# Patient Record
Sex: Male | Born: 1990 | Race: White | Hispanic: No | Marital: Married | Smoking: Current every day smoker
Health system: Southern US, Community
[De-identification: ages and names within clinical notes are randomized; demographics above are authoritative.]

## PROBLEM LIST (undated history)

## (undated) DIAGNOSIS — F419 Anxiety disorder, unspecified: Secondary | ICD-10-CM

## (undated) DIAGNOSIS — M1990 Unspecified osteoarthritis, unspecified site: Secondary | ICD-10-CM

## (undated) DIAGNOSIS — J302 Other seasonal allergic rhinitis: Secondary | ICD-10-CM

## (undated) HISTORY — DX: Anxiety disorder, unspecified: F41.9

## (undated) HISTORY — DX: Unspecified osteoarthritis, unspecified site: M19.90

## (undated) HISTORY — DX: Other seasonal allergic rhinitis: J30.2

---

## 2015-04-03 ENCOUNTER — Other Ambulatory Visit: Payer: Self-pay

## 2015-04-03 ENCOUNTER — Emergency Department: Payer: BLUE CROSS/BLUE SHIELD

## 2015-04-03 ENCOUNTER — Encounter: Payer: Self-pay | Admitting: Emergency Medicine

## 2015-04-03 ENCOUNTER — Emergency Department
Admission: EM | Admit: 2015-04-03 | Discharge: 2015-04-03 | Disposition: A | Discharge status: Home / Self Care | Payer: BLUE CROSS/BLUE SHIELD | Attending: Emergency Medicine | Admitting: Emergency Medicine

## 2015-04-03 DIAGNOSIS — Z720 Tobacco use: Secondary | ICD-10-CM | POA: Diagnosis not present

## 2015-04-03 DIAGNOSIS — Z880 Allergy status to penicillin: Secondary | ICD-10-CM | POA: Insufficient documentation

## 2015-04-03 DIAGNOSIS — K529 Noninfective gastroenteritis and colitis, unspecified: Secondary | ICD-10-CM | POA: Insufficient documentation

## 2015-04-03 DIAGNOSIS — R1013 Epigastric pain: Secondary | ICD-10-CM | POA: Diagnosis present

## 2015-04-03 LAB — CBC WITH DIFFERENTIAL/PLATELET
Basophils Absolute: 0 10*3/uL (ref 0–0.1)
Basophils Relative: 0 %
Eosinophils Absolute: 0 10*3/uL (ref 0–0.7)
Eosinophils Relative: 0 %
HEMATOCRIT: 48.2 % (ref 40.0–52.0)
HEMOGLOBIN: 16.6 g/dL (ref 13.0–18.0)
Lymphocytes Relative: 3 %
Lymphs Abs: 0.5 10*3/uL — ABNORMAL LOW (ref 1.0–3.6)
MCH: 28.5 pg (ref 26.0–34.0)
MCHC: 34.5 g/dL (ref 32.0–36.0)
MCV: 82.6 fL (ref 80.0–100.0)
MONOS PCT: 3 %
Monocytes Absolute: 0.5 10*3/uL (ref 0.2–1.0)
NEUTROS ABS: 14.3 10*3/uL — AB (ref 1.4–6.5)
Neutrophils Relative %: 94 %
Platelets: 165 10*3/uL (ref 150–440)
RBC: 5.84 MIL/uL (ref 4.40–5.90)
RDW: 13.3 % (ref 11.5–14.5)
WBC: 15.3 10*3/uL — AB (ref 3.8–10.6)

## 2015-04-03 LAB — COMPREHENSIVE METABOLIC PANEL
ALBUMIN: 4.7 g/dL (ref 3.5–5.0)
ALK PHOS: 69 U/L (ref 38–126)
ALT: 31 U/L (ref 17–63)
AST: 25 U/L (ref 15–41)
Anion gap: 10 (ref 5–15)
BUN: 20 mg/dL (ref 6–20)
CO2: 23 mmol/L (ref 22–32)
Calcium: 9.4 mg/dL (ref 8.9–10.3)
Chloride: 106 mmol/L (ref 101–111)
Creatinine, Ser: 0.73 mg/dL (ref 0.61–1.24)
GFR calc Af Amer: >60 mL/min (ref >60)
GFR calc non Af Amer: >60 mL/min (ref >60)
GLUCOSE: 126 mg/dL — AB (ref 65–99)
POTASSIUM: 3.6 mmol/L (ref 3.5–5.1)
Sodium: 139 mmol/L (ref 135–145)
Total Bilirubin: 1.4 mg/dL — ABNORMAL HIGH (ref 0.3–1.2)
Total Protein: 8.2 g/dL — ABNORMAL HIGH (ref 6.5–8.1)

## 2015-04-03 LAB — URINALYSIS COMPLETE WITH MICROSCOPIC (ARMC ONLY)
Bacteria, UA: NONE SEEN
Bilirubin Urine: NEGATIVE
Glucose, UA: NEGATIVE mg/dL
Hgb urine dipstick: NEGATIVE
Leukocytes, UA: NEGATIVE
Nitrite: NEGATIVE
Protein, ur: 30 mg/dL — AB
RBC / HPF: NONE SEEN RBC/hpf (ref 0–5)
SPECIFIC GRAVITY, URINE: 1.034 — AB (ref 1.005–1.030)
pH: 5 (ref 5.0–8.0)

## 2015-04-03 LAB — LIPASE, BLOOD: LIPASE: 28 U/L (ref 22–51)

## 2015-04-03 MED ORDER — IOHEXOL 300 MG/ML  SOLN
100.0000 mL | Freq: Once | INTRAMUSCULAR | Status: AC | PRN
  Administered 2015-04-03: 100 mL via INTRAVENOUS

## 2015-04-03 MED ORDER — IOHEXOL 300 MG/ML  SOLN: 25.0000 mL | Freq: Once | INTRAMUSCULAR | Status: DC | PRN

## 2015-04-03 MED ORDER — ONDANSETRON HCL 4 MG/2ML IJ SOLN
INTRAMUSCULAR | Status: AC
  Administered 2015-04-03: 4 mg via INTRAVENOUS
  Filled 2015-04-03: qty 2

## 2015-04-03 MED ORDER — MORPHINE SULFATE 4 MG/ML IJ SOLN
4.0000 mg | Freq: Once | INTRAMUSCULAR | Status: AC
  Administered 2015-04-03: 4 mg via INTRAVENOUS

## 2015-04-03 MED ORDER — MORPHINE SULFATE 4 MG/ML IJ SOLN
INTRAMUSCULAR | Status: AC
  Administered 2015-04-03: 4 mg via INTRAVENOUS
  Filled 2015-04-03: qty 1

## 2015-04-03 MED ORDER — KETOROLAC TROMETHAMINE 30 MG/ML IJ SOLN
INTRAMUSCULAR | Status: AC
  Administered 2015-04-03: 30 mg via INTRAVENOUS
  Filled 2015-04-03: qty 1

## 2015-04-03 MED ORDER — KETOROLAC TROMETHAMINE 30 MG/ML IJ SOLN
30.0000 mg | Freq: Once | INTRAMUSCULAR | Status: AC
  Administered 2015-04-03: 30 mg via INTRAVENOUS

## 2015-04-03 MED ORDER — SODIUM CHLORIDE 0.9 % IV SOLN
Freq: Once | INTRAVENOUS | Status: AC
  Administered 2015-04-03: 13:00:00 via INTRAVENOUS

## 2015-04-03 MED ORDER — IOHEXOL 240 MG/ML SOLN: 25.0000 mL | Freq: Once | INTRAMUSCULAR | Status: DC | PRN

## 2015-04-03 MED ORDER — SODIUM CHLORIDE 0.9 % IV BOLUS (SEPSIS)
1000.0000 mL | Freq: Once | INTRAVENOUS | Status: AC
  Administered 2015-04-03: 1000 mL via INTRAVENOUS

## 2015-04-03 MED ORDER — ONDANSETRON HCL 4 MG PO TABS: 4.0000 mg | ORAL_TABLET | Freq: Every day | ORAL | Status: AC | PRN

## 2015-04-03 MED ORDER — IOHEXOL 240 MG/ML SOLN
25.0000 mL | Freq: Once | INTRAMUSCULAR | Status: AC | PRN
  Administered 2015-04-03: 25 mL via ORAL

## 2015-04-03 MED ORDER — ONDANSETRON HCL 4 MG/2ML IJ SOLN
4.0000 mg | Freq: Once | INTRAMUSCULAR | Status: AC
  Administered 2015-04-03: 4 mg via INTRAVENOUS

## 2015-04-03 NOTE — ED Notes (Signed)
Pt awoke from sleep, N/V/D, and feeling weak since, denies fever

## 2015-04-03 NOTE — ED Notes (Signed)
MD at bedside. 

## 2015-04-03 NOTE — ED Notes (Signed)
Pt to ed with c/o centralized abd pain, n/v/d since yesterday.  Pt reports vomiting x 3 today, diarrhea x 4 today.  [facility].  Pt skin warm and dry.

## 2015-04-03 NOTE — ED Provider Notes (Addendum)
[facility] Emergency Department Provider Note  Time seen: 12:50 PM  I have reviewed the triage vital signs and the nursing notes.   HISTORY  Chief Complaint Abdominal Pain; Emesis; and Nausea    HPI Daniel Moss is a 24 y.o. male with no past medical history who presents the emergency department with nausea, vomiting, diarrhea, and epigastric abdominal pain 10 hours. According to the patient he was trying to go to sleep at around 2:30 AM he became acutely nauseated, with epigastric pain, felt like he had to have a bowel movement. He states he had very liquidy diarrhea, and vomited times one. He states he abdominal pain was gone after that, however approximately 15-30 minutes later the pain, nausea came back, and the patient had to go to the bathroom again. Patient states since he has had intermittent nausea, vomiting, diarrhea and a dull persistent epigastric pain. The patient states the pain is moderate in severity, dull/aching sensation, worse with vomiting. Denies any alcohol use. Denies any abdominal surgeries in the past. Denies any black or bloody stool. Patient states he continues to feel nauseated with mild epigastric pain at this time. His last bowel movement was apparently 1 hour ago.     History reviewed. No pertinent past medical history.  There are no active problems to display for this patient.   History reviewed. No pertinent past surgical history.  No current outpatient prescriptions on file.  Allergies Amoxicillin  No family history on file.  Social History History  Substance Use Topics  . Smoking status: Current Every Day Smoker  . Smokeless tobacco: Not on file  . Alcohol Use: Yes    Review of Systems Constitutional: Negative for fever. Cardiovascular: Negative for chest pain. Respiratory: Negative for shortness of breath. Gastrointestinal: Positive for epigastric abdominal pain, nausea, vomiting, diarrhea. Genitourinary:  Negative for dysuria. Musculoskeletal: Negative for back pain. 10-point ROS otherwise negative.  ____________________________________________   PHYSICAL EXAM:  VITAL SIGNS: ED Triage Vitals  Enc Vitals Group     BP 04/03/15 1244 132/86 mmHg     Pulse Rate 04/03/15 1244 84     Resp 04/03/15 1244 18     Temp 04/03/15 1244 97.6 F (36.4 C)     Temp Source 04/03/15 1244 Oral     SpO2 04/03/15 1244 98 %     Weight 04/03/15 1244 209 lb (94.802 kg)     Height 04/03/15 1244 5\' 8"  (1.727 m)     Head Cir --      Peak Flow --      Pain Score 04/03/15 1159 8     Pain Loc --      Pain Edu? --      Excl. in GC? --     Constitutional: Alert and oriented. No distress. Eyes: Normal exam ENT   Head: Normocephalic   Mouth/Throat: Dry mucous membranes. Cardiovascular: Normal rate, regular rhythm. No murmur Respiratory: Normal respiratory effort without tachypnea nor retractions. Breath sounds are clear  Gastrointestinal: Mild epigastric tenderness to palpation, no rebound or guarding. Abdomen otherwise benign. Musculoskeletal: Nontender with normal range of motion in all extremities.  Neurologic:  Normal speech and language. No gross focal neurologic deficits are appreciated. Speech is normal. Skin:  Skin is warm, dry and intact.  Psychiatric: Mood and affect are normal. Speech and behavior are normal.  ____________________________________________   RADIOLOGY  CT consistent multiple fluid-filled bowel loops, but no acute abnormalities.  ____________________________________________    INITIAL IMPRESSION / ASSESSMENT AND PLAN /  ED COURSE  Pertinent labs & imaging results that were available during my care of the patient were reviewed by me and considered in my medical decision making (see chart for details).  Patient with likely gastroenteritis, nausea, vomiting, mild epigastric abdominal pain. Patient's exam is normal besides dry mucous membranes, and mild epigastric  tenderness. Patient has no right upper quadrant tenderness to palpation, and no other abdominal tenderness palpation. We will check labs, urinalysis, lipase, IV hydrate, and treat nausea in the emergency department.  CT scan consistent with fluid-filled bowel loops, but no obstruction or other abnormality noted. Likely gastroenteritis. Patient feeling somewhat better, we'll discharge home on Zofran, Imodium, and supportive care.  Patient states mild chest discomfort/epigastric pain which continues, EKG was checked showing, reviewed and interpreted by myself showing normal sinus rhythm at 72 bpm, narrow QRS, normal axis, normal intervals, no ST changes noted. Overall normal EKG. We will continue with discharge home with supportive care. ____________________________________________   FINAL CLINICAL IMPRESSION(S) / ED DIAGNOSES  Nausea and vomiting Diarrhea Epigastric abdominal pain Gastroenteritis   Minna Antis, MD 04/03/15 0981  Minna Antis, MD 04/03/15 1914

## 2015-04-03 NOTE — Discharge Instructions (Signed)
Viral Gastroenteritis Viral gastroenteritis is also called stomach flu. This illness is caused by a certain type of germ (virus). It can cause sudden watery poop (diarrhea) and throwing up (vomiting). This can cause you to lose body fluids (dehydration). This illness usually lasts for 3 to 8 days. It usually goes away on its own. HOME CARE   Drink enough fluids to keep your pee (urine) clear or pale yellow. Drink small amounts of fluids often.  Ask your doctor how to replace body fluid losses (rehydration).  Avoid:  Foods high in sugar.  Alcohol.  Bubbly (carbonated) drinks.  Tobacco.  Juice.  Caffeine drinks.  Very hot or cold fluids.  Fatty, greasy foods.  Eating too much at one time.  Dairy products until 24 to 48 hours after your watery poop stops.  You may eat foods with active cultures (probiotics). They can be found in some yogurts and supplements.  Wash your hands well to avoid spreading the illness.  Only take medicines as told by your doctor. Do not give aspirin to children. Do not take medicines for watery poop (antidiarrheals).  Ask your doctor if you should keep taking your regular medicines.  Keep all doctor visits as told. GET HELP RIGHT AWAY IF:   You cannot keep fluids down.  You do not pee at least once every 6 to 8 hours.  You are short of breath.  You see blood in your poop or throw up. This may look like coffee grounds.  You have belly (abdominal) pain that gets worse or is just in one small spot (localized).  You keep throwing up or having watery poop.  You have a fever.  The patient is a child younger than 3 months, and he or she has a fever.  The patient is a child older than 3 months, and he or she has a fever and problems that do not go away.  The patient is a child older than 3 months, and he or she has a fever and problems that suddenly get worse.  The patient is a baby, and he or she has no tears when crying. MAKE SURE YOU:     Understand these instructions.  Will watch your condition.  Will get help right away if you are not doing well or get worse. Document Released: 03/10/2008 Document Revised: 12/15/2011 Document Reviewed: 07/09/2011 Exit[employer] Patient Information 2015 [employer]. This information is not intended to replace advice given to you by your health care provider. Make sure you discuss any questions you have with your health care provider.  

## 2016-06-07 IMAGING — CT CT ABD-PELV W/ CM
1 of 2 series · 15 of 32 positions shown, 19 images · IV contrast (omnipaque)
Comparison: None.

CLINICAL DATA: Abdominal pain and nausea, diarrhea and vomiting for
multiple days

EXAM:
CT ABDOMEN AND PELVIS WITH CONTRAST
TECHNIQUE: Multidetector CT imaging of the abdomen and pelvis was performed
using the standard protocol following bolus administration of
intravenous contrast.
CONTRAST:  100mL OMNIPAQUE IOHEXOL 300 MG/ML SOLN, 25mL OMNIPAQUE
IOHEXOL 240 MG/ML SOLN, 1 OMNIPAQUE IOHEXOL 240 MG/ML SOLN

[Series 2: routine abd pel with · axial · 0.79mm/px · z∈[-865,-390]mm · 15 of 105 slices shown, 19 images]
[im 5/105  soft-tissue]
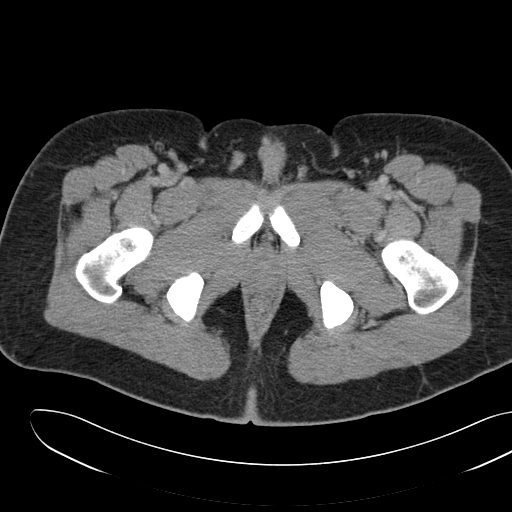
[im 5/105  bone]
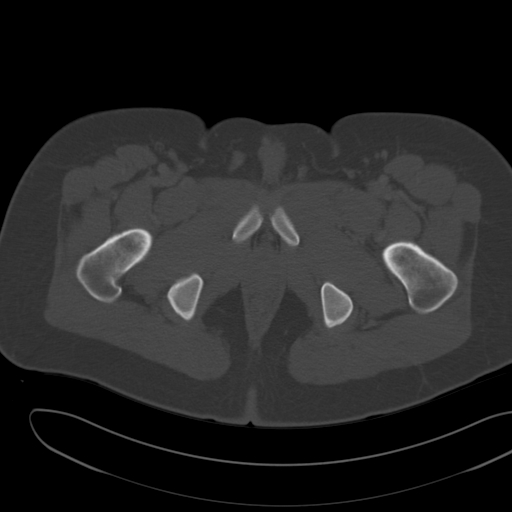
[im 14/105  soft-tissue]
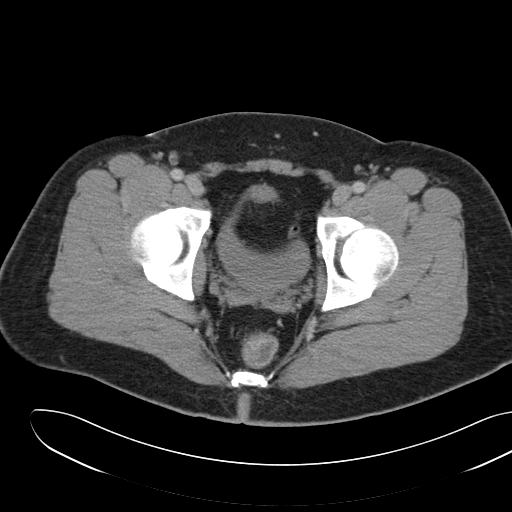
[im 23/105  soft-tissue]
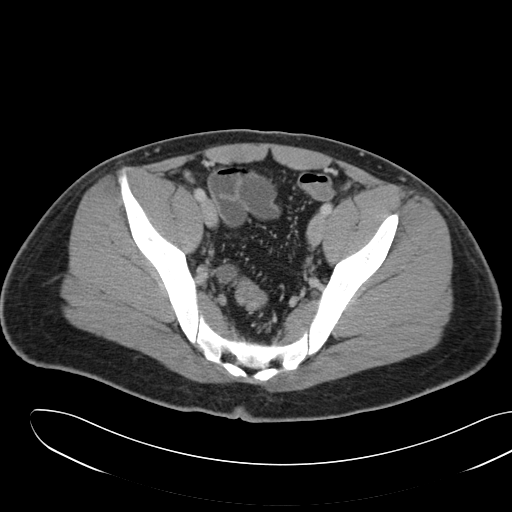
[im 28/105  soft-tissue]
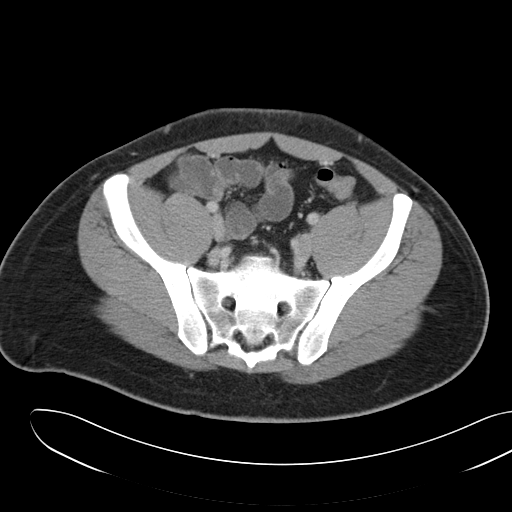
[im 37/105  soft-tissue]
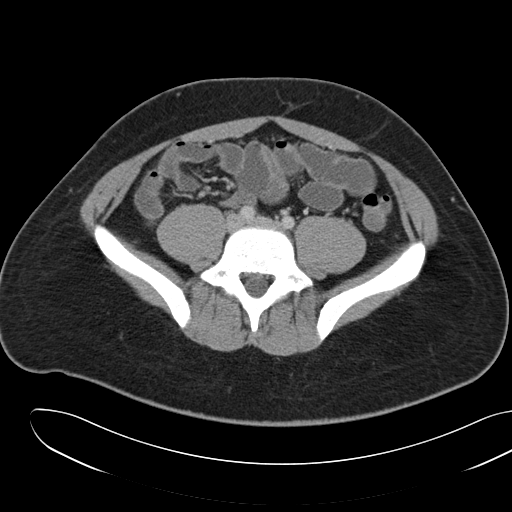
[im 46/105  soft-tissue]
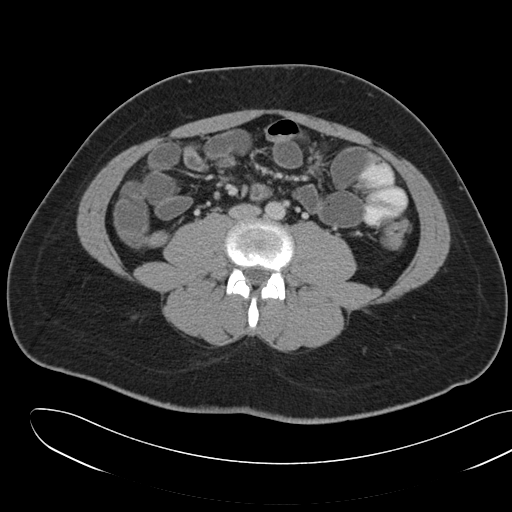
[im 55/105  soft-tissue]
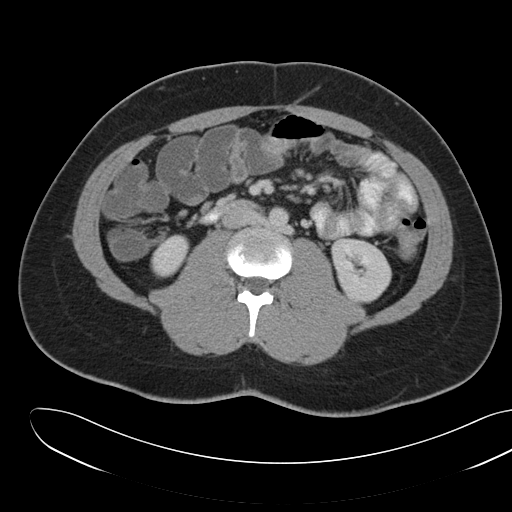
[im 59/105  soft-tissue]
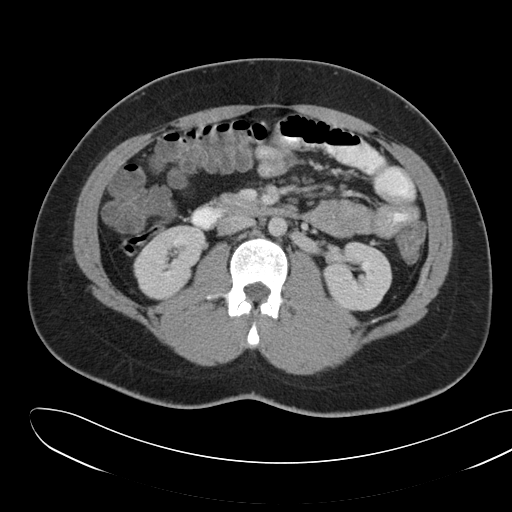
[im 68/105  soft-tissue]
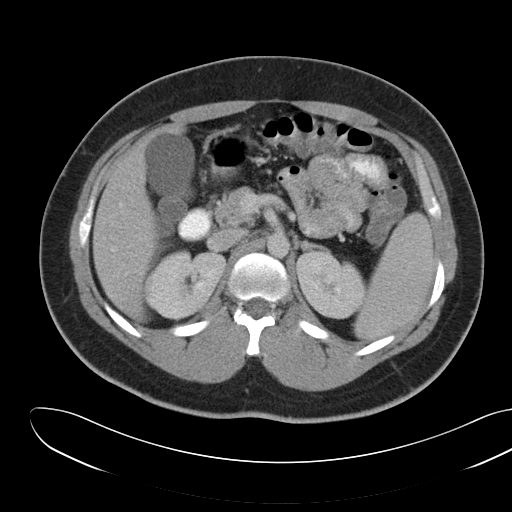
[im 68/105  bone]
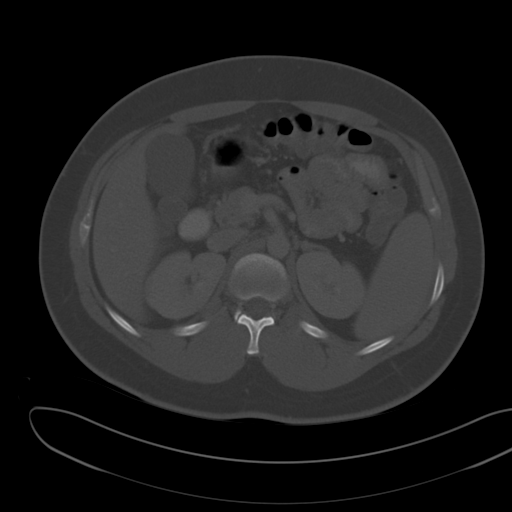
[im 77/105  soft-tissue]
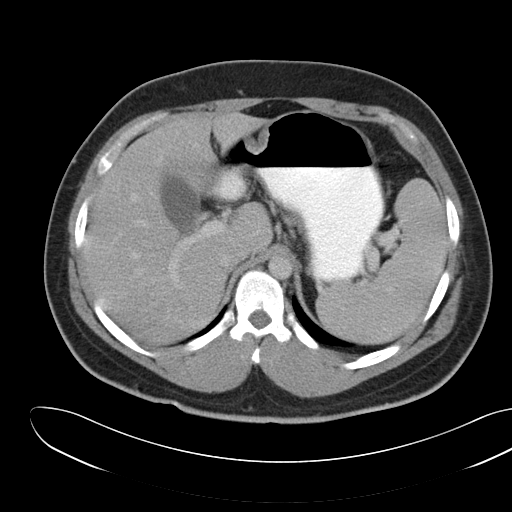
[im 82/105  soft-tissue]
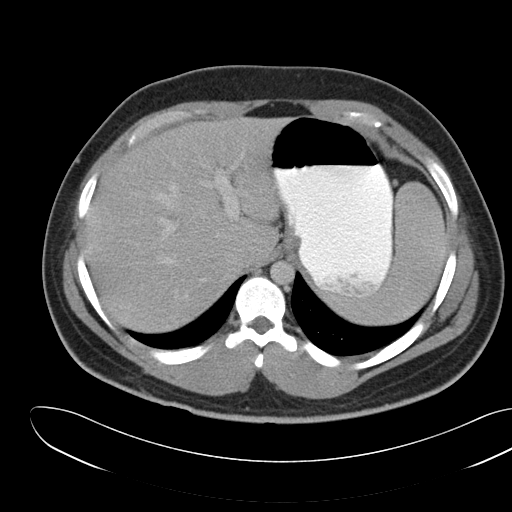
[im 86/105  lung]
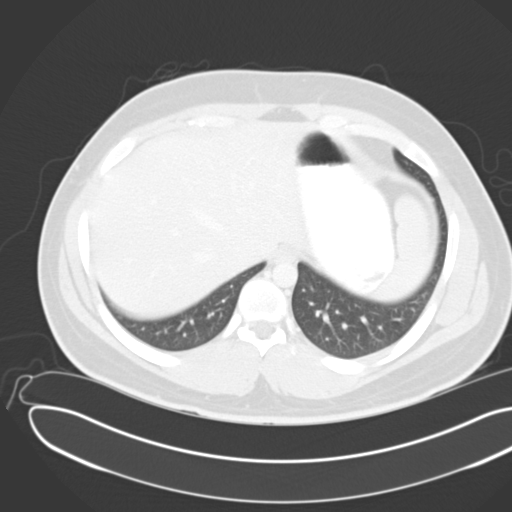
[im 91/105  soft-tissue]
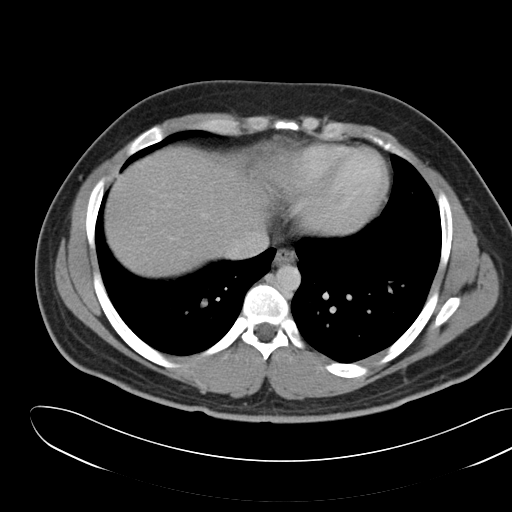
[im 91/105  lung]
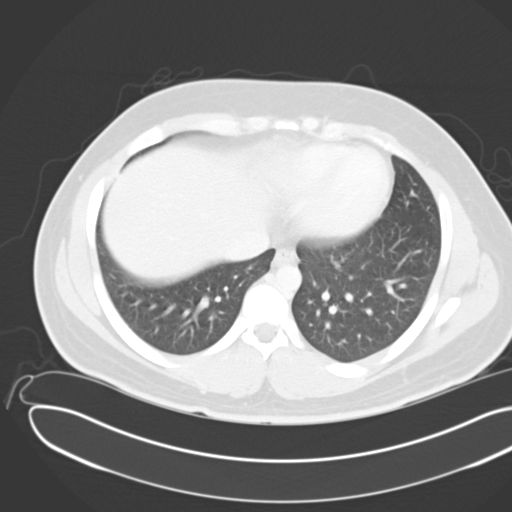
[im 95/105  lung]
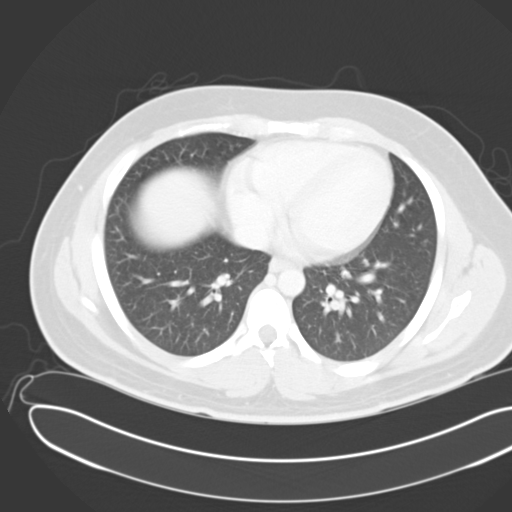
[im 100/105  soft-tissue]
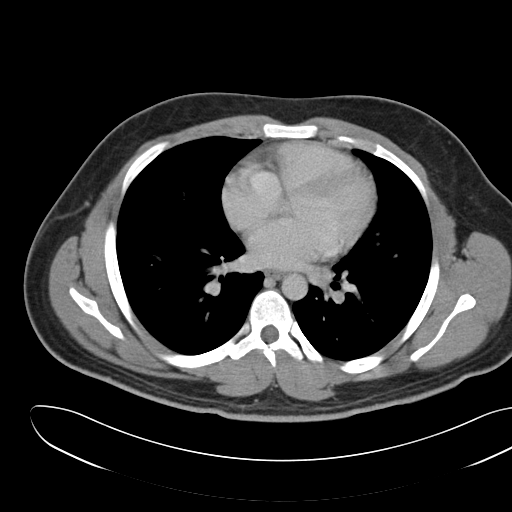
[im 100/105  lung]
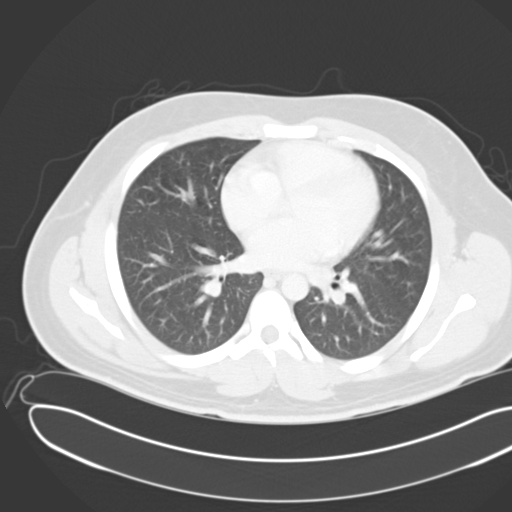

[15 of 32 positions shown; findings below may reference images not displayed]

FINDINGS: Lung bases are free of acute infiltrate or sizable effusion.

The liver is slightly decreased in attenuation consistent with fatty
infiltration. The spleen, gallbladder, adrenal glands, kidneys and
pancreas are within normal limits. No obstructive changes are noted
within the kidneys. The appendix is well visualized and air filled.
Multiple fluid-filled loops of large and small bowel are noted
consistent with the patient's given clinical history.

Retro aortic left renal vein is noted. No significant
lymphadenopathy is seen. The bladder is partially distended. No
pelvic mass lesion or sidewall abnormality is noted. The osseous
structures are within normal limits.
IMPRESSION: Multiple fluid-filled loops of bowel are identified consistent with
the patient's given clinical history. No acuteabnormality is
identified.

## 2021-02-18 ENCOUNTER — Other Ambulatory Visit: Payer: Self-pay

## 2021-02-18 ENCOUNTER — Encounter: Payer: Self-pay | Admitting: Internal Medicine

## 2021-02-18 ENCOUNTER — Ambulatory Visit (INDEPENDENT_AMBULATORY_CARE_PROVIDER_SITE_OTHER): Payer: Commercial Managed Care - PPO | Admitting: Internal Medicine

## 2021-02-18 VITALS — BP 134/74 | HR 70 | Ht 69.0 in | Wt 282.5 lb

## 2021-02-18 DIAGNOSIS — Z6841 Body Mass Index (BMI) 40.0 and over, adult: Secondary | ICD-10-CM

## 2021-02-18 DIAGNOSIS — F17200 Nicotine dependence, unspecified, uncomplicated: Secondary | ICD-10-CM | POA: Diagnosis not present

## 2021-02-18 DIAGNOSIS — K760 Fatty (change of) liver, not elsewhere classified: Secondary | ICD-10-CM | POA: Insufficient documentation

## 2021-02-18 DIAGNOSIS — E66813 Obesity, class 3: Secondary | ICD-10-CM | POA: Insufficient documentation

## 2021-02-18 NOTE — Assessment & Plan Note (Signed)
Patient was advised to lose 50 pounds of weight.  He is negative for hepatitis C negative for HIV.  He says he does not drink anymore.

## 2021-02-18 NOTE — Assessment & Plan Note (Signed)
-   I instructed the patient to stop smoking and provided them with smoking cessation materials.  - I informed the patient that smoking puts them at increased risk for cancer, COPD, hypertension, and more.  - Informed the patient to seek help if they begin to have trouble breathing, develop chest pain, start to cough up blood, feel faint, or pass out.  

## 2021-02-18 NOTE — Assessment & Plan Note (Signed)

## 2021-02-18 NOTE — Progress Notes (Signed)
New Patient Office Visit  Subjective:  Patient ID: Daniel Moss, male    DOB: 11-26-1990  Age: 30 y.o. MRN: [id number]  CC:  Chief Complaint  Patient presents with  . New Patient (Initial Visit)    HPI Patient presents for general checkup.  Patient main problem is obesity.  He also has a fatty liver.  His CT scan done in 2016 reveals that he has fatty infiltration.  SGOT and SGPT are elevated  On the recent blood test.  Patient is interested to lose weight.  He was instructed on [employer] or Mediterranean diet.  He smoked half a pack per day and was advised to quit smoking.  He does not drink alcohol. Drugs or marijuana.  He is negative for HIV and hep C from the left test done from the out side lab. .  Past Medical History:  Diagnosis Date  . Anxiety   . Arthritis   . Seasonal allergies     No current outpatient medications on file.   History reviewed. No pertinent surgical history.  History reviewed. No pertinent family history.  Social History   Socioeconomic History  . Marital status: Married    Spouse name: Not on file  . Number of children: Not on file  . Years of education: Not on file  . Highest education level: Not on file  Occupational History  . Not on file  Tobacco Use  . Smoking status: Current Every Day Smoker  . Smokeless tobacco: Never Used  Substance and Sexual Activity  . Alcohol use: Not Currently  . Drug use: No  . Sexual activity: Yes  Other Topics Concern  . Not on file  Social History Narrative  . Not on file   Social Determinants of Health   Financial Resource Strain: Not on file  Food Insecurity: Not on file  Transportation Needs: Not on file  Physical Activity: Not on file  Stress: Not on file  Social Connections: Not on file  Intimate Partner Violence: Not on file    ROS Review of Systems  Constitutional: Negative.   HENT: Negative.   Eyes: Negative.   Respiratory: Negative.   Cardiovascular: Negative.    Gastrointestinal: Negative.   Endocrine: Negative.   Genitourinary: Negative.   Musculoskeletal: Negative.   Skin: Negative.   Allergic/Immunologic: Negative.   Neurological: Negative.   Hematological: Negative.   Psychiatric/Behavioral: Negative.   All other systems reviewed and are negative.   Objective:   Today's Vitals: BP 134/74 (BP Location: Right Arm, Patient Position: Sitting, Cuff Size: Large)   Pulse 70   Ht 5\' 9"  (1.753 m)   Wt 282 lb 8 oz (128.1 kg)   BMI 41.72 kg/m   Physical Exam Vitals reviewed.  Constitutional:      Appearance: Normal appearance.  HENT:     Head: Atraumatic.     Right Ear: Tympanic membrane normal.     Left Ear: Tympanic membrane normal.     Nose: Nose normal.     Mouth/Throat:     Mouth: Mucous membranes are moist.  Eyes:     Pupils: Pupils are equal, round, and reactive to light.  Cardiovascular:     Rate and Rhythm: Normal rate and regular rhythm.     Pulses: Normal pulses.  Pulmonary:     Effort: Pulmonary effort is normal. No respiratory distress.  Abdominal:     General: Bowel sounds are normal.  Musculoskeletal:        General: Normal  range of motion.     Cervical back: Normal range of motion.  Neurological:     General: No focal deficit present.  Psychiatric:        Mood and Affect: Mood normal.     Assessment & Plan:   Problem List Items Addressed This Visit      Digestive   Fatty liver    Patient was advised to lose 50 pounds of weight.  He is negative for hepatitis C negative for HIV.  He says he does not drink anymore.        Other   Smoker    - I instructed the patient to stop smoking and provided them with smoking cessation materials.  - I informed the patient that smoking puts them at increased risk for cancer, COPD, hypertension, and more.  - Informed the patient to seek help if they begin to have trouble breathing, develop chest pain, start to cough up blood, feel faint, or pass out.      Class 3  severe obesity due to excess calories without serious comorbidity with body mass index (BMI) of 40.0 to 44.9 in adult [facility]) - Primary    - I encouraged the patient to lose weight.  - I educated them on making healthy dietary choices including eating more fruits and vegetables and less fried foods. - I encouraged the patient to exercise more, and educated on the benefits of exercise including weight loss, diabetes prevention, and hypertension prevention.   Dietary counseling with a registered dietician  Referral to a weight management support group (e.g. Weight Watchers, Overeaters Anonymous)  If your BMI is greater than 29 or you have gained more than 15 pounds you should work on weight loss.  Attend a healthy cooking class        Meal Ideas from meditreanean diet Breakfast:  Whole wheat toast or whole wheat English muffins with peanut butter & hard boiled egg Steel cut oats topped with apples & cinnamon and skim milk  Fresh fruit: banana, strawberries, melon, berries, peaches  Smoothies: strawberries, bananas, greek yogurt, peanut butter Low fat greek yogurt with blueberries and granola  Egg white omelet with spinach and mushrooms Breakfast couscous: whole wheat couscous, apricots, skim milk, cranberries  Sandwiches:  Hummus and grilled vegetables (peppers, zucchini, squash) on whole wheat bread   Grilled chicken on whole wheat pita with lettuce, tomatoes, cucumbers or tzatziki  [place] salad on whole wheat bread: tuna salad made with greek yogurt, olives, red peppers, capers, green onions Garlic rosemary lamb pita: lamb sauted with garlic, rosemary, salt & pepper; add lettuce, cucumber, greek yogurt to pita - flavor with lemon juice and black pepper  Seafood:  Mediterranean grilled salmon, seasoned with garlic, basil, parsley, lemon juice and black pepper Shrimp, lemon, and spinach whole-grain pasta salad made with low fat greek yogurt  Seared scallops with lemon orzo  Seared tuna  steaks seasoned salt, pepper, coriander topped with tomato mixture of olives, tomatoes, olive oil, minced garlic, parsley, green onions and cappers  Meats:  Herbed greek chicken salad with kalamata olives, cucumber, feta  Red bell peppers stuffed with spinach, bulgur, lean ground beef (or lentils) & topped with feta   Kebabs: skewers of chicken, tomatoes, onions, zucchini, squash  [place] burgers: made with red onions, mint, dill, lemon juice, feta cheese topped with roasted red peppers Vegetarian Cucumber salad: cucumbers, artichoke hearts, celery, red onion, feta cheese, tossed in olive oil & lemon juice  Hummus and whole grain pita points  with a greek salad (lettuce, tomato, feta, olives, cucumbers, red onion) Lentil soup with celery, carrots made with vegetable broth, garlic, salt and pepper  Tabouli salad: parsley, bulgur, mint, scallions, cucumbers, tomato, radishes, lemon juice, olive oil, salt and pepper.  No outpatient encounter medications on file as of 02/18/2021.   No facility-administered encounter medications on file as of 02/18/2021.    Follow-up: No follow-ups on file.   Corky Downs, MD

## 2021-05-21 ENCOUNTER — Other Ambulatory Visit: Payer: Self-pay

## 2021-05-21 ENCOUNTER — Ambulatory Visit: Payer: Commercial Managed Care - PPO | Admitting: Internal Medicine

## 2021-05-22 ENCOUNTER — Ambulatory Visit (INDEPENDENT_AMBULATORY_CARE_PROVIDER_SITE_OTHER): Payer: Commercial Managed Care - PPO | Admitting: Internal Medicine

## 2021-05-22 ENCOUNTER — Encounter: Payer: Self-pay | Admitting: Internal Medicine

## 2021-05-22 VITALS — BP 150/94 | HR 68 | Ht 69.0 in | Wt 280.4 lb

## 2021-05-22 DIAGNOSIS — F17200 Nicotine dependence, unspecified, uncomplicated: Secondary | ICD-10-CM | POA: Diagnosis not present

## 2021-05-22 DIAGNOSIS — Z6841 Body Mass Index (BMI) 40.0 and over, adult: Secondary | ICD-10-CM

## 2021-05-22 DIAGNOSIS — K760 Fatty (change of) liver, not elsewhere classified: Secondary | ICD-10-CM | POA: Diagnosis not present

## 2021-05-22 NOTE — Assessment & Plan Note (Signed)
-   I instructed the patient to stop smoking and provided them with smoking cessation materials.  - I informed the patient that smoking puts them at increased risk for cancer, COPD, hypertension, and more.  - Informed the patient to seek help if they begin to have trouble breathing, develop chest pain, start to cough up blood, feel faint, or pass out.  

## 2021-05-22 NOTE — Progress Notes (Signed)
Established Patient Office Visit  Subjective:  Patient ID: Daniel Moss, male    DOB: 11-19-1990  Age: 30 y.o. MRN: [id number]  CC:  Chief Complaint  Patient presents with   Follow-up    HPI  Delmos Velaquez presents for general checkup patient is Dr. Juel Burrow anxiety seasonal allergies and types of arthritis.  He does not complain of any chest pain or shortness of breath.  Patient has a problem with obesity and is trying to lose weight.  He also snores at night.  Trying to cut down on smoking.  He also complains of insomnia. Past Medical History:  Diagnosis Date   Anxiety    Arthritis    Seasonal allergies     History reviewed. No pertinent surgical history.  History reviewed. No pertinent family history.  Social History   Socioeconomic History   Marital status: Married    Spouse name: Not on file   Number of children: Not on file   Years of education: Not on file   Highest education level: Not on file  Occupational History   Not on file  Tobacco Use   Smoking status: Every Day   Smokeless tobacco: Never  Substance and Sexual Activity   Alcohol use: Not Currently   Drug use: No   Sexual activity: Yes  Other Topics Concern   Not on file  Social History Narrative   Not on file   Social Determinants of Health   Financial Resource Strain: Not on file  Food Insecurity: Not on file  Transportation Needs: Not on file  Physical Activity: Not on file  Stress: Not on file  Social Connections: Not on file  Intimate Partner Violence: Not on file    No current outpatient medications on file.   Allergies  Allergen Reactions   Amoxicillin Rash    ROS Review of Systems  Constitutional: Negative.   HENT: Negative.    Eyes: Negative.   Respiratory: Negative.    Cardiovascular: Negative.   Gastrointestinal: Negative.   Endocrine: Negative.   Genitourinary: Negative.   Musculoskeletal: Negative.   Skin: Negative.   Allergic/Immunologic: Negative.    Neurological: Negative.   Hematological: Negative.   Psychiatric/Behavioral: Negative.    All other systems reviewed and are negative.    Objective:    Physical Exam Vitals reviewed.  Constitutional:      Appearance: Normal appearance.  HENT:     Mouth/Throat:     Mouth: Mucous membranes are moist.  Eyes:     Pupils: Pupils are equal, round, and reactive to light.  Neck:     Vascular: No carotid bruit.  Cardiovascular:     Rate and Rhythm: Normal rate and regular rhythm.     Pulses: Normal pulses.     Heart sounds: Normal heart sounds.  Pulmonary:     Effort: Pulmonary effort is normal.     Breath sounds: Normal breath sounds.  Abdominal:     General: Bowel sounds are normal.     Palpations: Abdomen is soft. There is no hepatomegaly, splenomegaly or mass.     Tenderness: There is no abdominal tenderness.     Hernia: No hernia is present.  Musculoskeletal:     Cervical back: Neck supple.     Right lower leg: No edema.     Left lower leg: No edema.  Skin:    Findings: No rash.  Neurological:     Mental Status: He is alert and oriented to person, place, and time.  Motor: No weakness.  Psychiatric:        Mood and Affect: Mood normal.        Behavior: Behavior normal.    BP (!) 150/94   Pulse 68   Ht 5\' 9"  (1.753 m)   Wt 280 lb 6.4 oz (127.2 kg)   BMI 41.41 kg/m  Wt Readings from Last 3 Encounters:  05/22/21 280 lb 6.4 oz (127.2 kg)  02/18/21 282 lb 8 oz (128.1 kg)  04/03/15 209 lb (94.8 kg)     Health Maintenance Due  Topic Date Due   COVID-19 Vaccine (1) Never done   Pneumococcal Vaccine 46-24 Years old (1 - PCV) Never done   HIV Screening  Never done   Hepatitis C Screening  Never done   TETANUS/TDAP  Never done   INFLUENZA VACCINE  05/06/2021    There are no preventive care reminders to display for this patient.  No results found for: TSH Lab Results  Component Value Date   WBC 15.3 (H) 04/03/2015   HGB 16.6 04/03/2015   HCT 48.2  04/03/2015   MCV 82.6 04/03/2015   PLT 165 04/03/2015   Lab Results  Component Value Date   NA 139 04/03/2015   K 3.6 04/03/2015   CO2 23 04/03/2015   GLUCOSE 126 (H) 04/03/2015   BUN 20 04/03/2015   CREATININE 0.73 04/03/2015   BILITOT 1.4 (H) 04/03/2015   ALKPHOS 69 04/03/2015   AST 25 04/03/2015   ALT 31 04/03/2015   PROT 8.2 (H) 04/03/2015   ALBUMIN 4.7 04/03/2015   CALCIUM 9.4 04/03/2015   ANIONGAP 10 04/03/2015   No results found for: CHOL No results found for: HDL No results found for: LDLCALC No results found for: TRIG No results found for: CHOLHDL No results found for: 04/05/2015    Assessment & Plan:   Problem List Items Addressed This Visit       Digestive   Fatty liver - Primary    - I encouraged the patient to lose weight.  - I educated them on making healthy dietary choices including eating more fruits and vegetables and less fried foods. - I encouraged the patient to exercise more, and educated on the benefits of exercise including weight loss, diabetes prevention, and hypertension prevention.   Dietary counseling with a registered dietician  Referral to a weight management support group (e.g. Weight Watchers, Overeaters Anonymous)  If your BMI is greater than 29 or you have gained more than 15 pounds you should work on weight loss.  Attend a healthy cooking class         Other   Smoker    - I instructed the patient to stop smoking and provided them with smoking cessation materials.  - I informed the patient that smoking puts them at increased risk for cancer, COPD, hypertension, and more.  - Informed the patient to seek help if they begin to have trouble breathing, develop chest pain, start to cough up blood, feel faint, or pass out.      Class 3 severe obesity due to excess calories without serious comorbidity with body mass index (BMI) of 40.0 to 44.9 in adult [facility])    - I instructed the patient to stop smoking and provided them with smoking  cessation materials.  - I informed the patient that smoking puts them at increased risk for cancer, COPD, hypertension, and more.  - Informed the patient to seek help if they begin to have trouble breathing, develop chest  pain, start to cough up blood, feel faint, or pass out.       No orders of the defined types were placed in this encounter.   Follow-up: No follow-ups on file.    Corky Downs, MD

## 2021-05-22 NOTE — Assessment & Plan Note (Signed)

## 2021-08-20 ENCOUNTER — Ambulatory Visit: Payer: Commercial Managed Care - PPO | Admitting: Internal Medicine
